# Patient Record
Sex: Male | Born: 1975 | Race: White | Hispanic: No | Marital: Married | Smoking: Current every day smoker
Health system: Southern US, Community
[De-identification: ages and names within clinical notes are randomized; demographics above are authoritative.]

## PROBLEM LIST (undated history)

## (undated) DIAGNOSIS — N200 Calculus of kidney: Secondary | ICD-10-CM

## (undated) DIAGNOSIS — C6290 Malignant neoplasm of unspecified testis, unspecified whether descended or undescended: Secondary | ICD-10-CM

## (undated) HISTORY — DX: Malignant neoplasm of unspecified testis, unspecified whether descended or undescended: C62.90

## (undated) HISTORY — PX: RADICAL ORCHIECTOMY: SHX2285

## (undated) HISTORY — DX: Calculus of kidney: N20.0

---

## 2004-10-05 ENCOUNTER — Emergency Department: Payer: Self-pay | Admitting: Emergency Medicine

## 2005-03-01 ENCOUNTER — Ambulatory Visit: Payer: Self-pay | Admitting: Family Medicine

## 2005-05-25 ENCOUNTER — Emergency Department: Payer: Self-pay | Admitting: Emergency Medicine

## 2006-06-23 ENCOUNTER — Emergency Department: Payer: Self-pay | Admitting: Emergency Medicine

## 2006-08-28 ENCOUNTER — Emergency Department: Payer: Self-pay | Admitting: Emergency Medicine

## 2007-07-08 ENCOUNTER — Encounter (INDEPENDENT_AMBULATORY_CARE_PROVIDER_SITE_OTHER): Payer: Self-pay | Admitting: *Deleted

## 2007-07-31 ENCOUNTER — Ambulatory Visit: Payer: Self-pay | Admitting: Family Medicine

## 2007-07-31 DIAGNOSIS — J309 Allergic rhinitis, unspecified: Secondary | ICD-10-CM | POA: Insufficient documentation

## 2007-08-05 ENCOUNTER — Encounter: Payer: Self-pay | Admitting: Family Medicine

## 2008-02-02 ENCOUNTER — Emergency Department: Payer: Self-pay | Admitting: Emergency Medicine

## 2008-02-04 ENCOUNTER — Inpatient Hospital Stay: Payer: Self-pay | Admitting: Internal Medicine

## 2008-05-02 ENCOUNTER — Emergency Department: Payer: Self-pay | Admitting: Emergency Medicine

## 2008-05-03 ENCOUNTER — Inpatient Hospital Stay: Payer: Self-pay | Admitting: Internal Medicine

## 2008-10-20 ENCOUNTER — Ambulatory Visit: Payer: Self-pay | Admitting: Family Medicine

## 2008-10-20 DIAGNOSIS — A630 Anogenital (venereal) warts: Secondary | ICD-10-CM | POA: Insufficient documentation

## 2008-12-07 ENCOUNTER — Ambulatory Visit: Payer: Self-pay | Admitting: Family Medicine

## 2008-12-07 DIAGNOSIS — N200 Calculus of kidney: Secondary | ICD-10-CM

## 2008-12-07 DIAGNOSIS — M6740 Ganglion, unspecified site: Secondary | ICD-10-CM

## 2008-12-07 HISTORY — DX: Calculus of kidney: N20.0

## 2009-05-18 ENCOUNTER — Ambulatory Visit: Payer: Self-pay | Admitting: Family Medicine

## 2009-05-18 DIAGNOSIS — M25539 Pain in unspecified wrist: Secondary | ICD-10-CM | POA: Insufficient documentation

## 2009-07-04 ENCOUNTER — Ambulatory Visit: Payer: Self-pay | Admitting: Family Medicine

## 2009-08-15 ENCOUNTER — Ambulatory Visit: Payer: Self-pay | Admitting: Family Medicine

## 2009-09-26 ENCOUNTER — Ambulatory Visit: Payer: Self-pay | Admitting: Family Medicine

## 2009-09-26 DIAGNOSIS — J019 Acute sinusitis, unspecified: Secondary | ICD-10-CM | POA: Insufficient documentation

## 2009-09-29 ENCOUNTER — Telehealth: Payer: Self-pay | Admitting: Family Medicine

## 2009-09-30 ENCOUNTER — Ambulatory Visit: Payer: Self-pay | Admitting: Family Medicine

## 2009-10-01 ENCOUNTER — Encounter: Payer: Self-pay | Admitting: Family Medicine

## 2009-11-07 ENCOUNTER — Telehealth: Payer: Self-pay | Admitting: Family Medicine

## 2009-11-10 ENCOUNTER — Ambulatory Visit: Payer: Self-pay | Admitting: Family Medicine

## 2010-03-01 ENCOUNTER — Ambulatory Visit: Payer: Self-pay | Admitting: Family Medicine

## 2010-03-01 DIAGNOSIS — L989 Disorder of the skin and subcutaneous tissue, unspecified: Secondary | ICD-10-CM | POA: Insufficient documentation

## 2010-03-01 DIAGNOSIS — F17200 Nicotine dependence, unspecified, uncomplicated: Secondary | ICD-10-CM

## 2010-03-01 DIAGNOSIS — M25569 Pain in unspecified knee: Secondary | ICD-10-CM | POA: Insufficient documentation

## 2010-03-01 DIAGNOSIS — L259 Unspecified contact dermatitis, unspecified cause: Secondary | ICD-10-CM

## 2010-03-09 ENCOUNTER — Telehealth: Payer: Self-pay | Admitting: Family Medicine

## 2010-03-29 ENCOUNTER — Ambulatory Visit: Payer: Self-pay | Admitting: Family Medicine

## 2010-03-29 DIAGNOSIS — K921 Melena: Secondary | ICD-10-CM | POA: Insufficient documentation

## 2010-03-29 DIAGNOSIS — R21 Rash and other nonspecific skin eruption: Secondary | ICD-10-CM | POA: Insufficient documentation

## 2010-03-30 LAB — CONVERTED CEMR LAB
Basophils Absolute: 0 10*3/uL (ref 0.0–0.1)
Eosinophils Relative: 2.8 % (ref 0.0–5.0)
H Pylori IgG: NEGATIVE
MCV: 92.5 fL (ref 78.0–100.0)
Monocytes Absolute: 0.5 10*3/uL (ref 0.1–1.0)
Neutrophils Relative %: 66.9 % (ref 43.0–77.0)
Platelets: 176 10*3/uL (ref 150.0–400.0)
RDW: 12.7 % (ref 11.5–14.6)
WBC: 7.8 10*3/uL (ref 4.5–10.5)

## 2010-05-12 ENCOUNTER — Telehealth: Payer: Self-pay | Admitting: Family Medicine
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
## 2010-07-25 NOTE — Assessment & Plan Note (Signed)
Summary: KNOTS UNDER ARM PITS/DLO   Vital Signs:  Patient profile:   35 year old male Weight:      230 pounds Temp:     98.5 degrees F oral Pulse rate:   76 / minute Pulse rhythm:   regular BP sitting:   120 / 72  (left arm) Cuff size:   large  Vitals Entered By: Sydell Axon LPN (August 15, 2009 9:25 AM) CC: Knots under both arm pits and soreness and  bumps on back   History of Present Illness: Pt of Dr Copland's here for painful knots under both arm pits who wanted top be seen quickly. Friday the nodes  were larger and more painful. He now has none under the left, the right still has some  there. He has an outbreak on the back and upper arms of individual minimally papular 2mm lesions sporadically. He has gone online and realizes the lymph nodes are  probably  from infection. He had an ingrown hair of right face, hair 11/2 inches long when he did "mior surgery" on himself to extract it. He is now allowing his beard to grow out to  allow healing. Marland Kitchen He had had signif MRSA infection of the lower left leg with hospitalization approx 2 years ago and was sunbsequently on Clindamycin a while and hasn't had known recurrence. He otherwise feels well and is very healthy, exercising regularly.  Problems Prior to Update: 1)  Laceration of Finger  (ICD-883.0) 2)  Wrist Pain, Right  (ICD-719.43) 3)  Nephrolithiasis  (ICD-592.0) 4)  Ganglion Cyst, Wrist, Right  (ICD-727.41) 5)  Sexually Transmitted Disease, Exposure To  (ICD-V01.6) 6)  Venereal Wart  (ICD-078.11) 7)  Uri  (ICD-465.9) 8)  Allergic Rhinitis Cause Unspecified  (ICD-477.9)  Medications Prior to Update: 1)  None  Allergies: No Known Drug Allergies  Physical Exam  General:  Well-developed,well-nourished,in no acute distress; alert,appropriate and cooperative throughout examination Head:  Normocephalic and atraumatic without obvious abnormalities. No apparent alopecia or balding. Eyes:  Conjunctiva clear bilaterally.  Ears:   External ear exam shows no significant lesions or deformities.  Otoscopic examination reveals clear canals, tympanic membranes are intact bilaterally without bulging, retraction, inflammation or discharge. Hearing is grossly normal bilaterally. Nose:  External nasal examination shows no deformity or inflammation. Nasal mucosa are pink and moist without lesions or exudates. Mouth:  Oral mucosa and oropharynx without lesions or exudates.  Teeth in good repair. Neck:  No deformities, masses, or tenderness noted. Chest Wall:  No deformities, masses, tenderness or gynecomastia noted. Lungs:  Normal respiratory effort, chest expands symmetrically. Lungs are clear to auscultation, no crackles or wheezes. Heart:  Normal rate and regular rhythm. S1 and S2 normal without gallop, murmur, click, rub or other extra sounds. Abdomen:  Bowel sounds positive,abdomen soft and non-tender without masses, organomegaly or hernias noted. Msk:  No deformity or scoliosis noted of thoracic or lumbar spine.   Extremities:  No clubbing, cyanosis, edema, or deformity noted with normal full range of motion of all joints.  Tatoo of arm. Skin:  Sparse focal papular outbreak of mildly erythematous lesions ont eh trunk mostly, some on the upper extremities. No warmth, no tenderness. Cervical Nodes:  No lymphadenopathy noted, no supraclavicular nodes felt. Axillary Nodes:  Shoddy' mobile slightly tender nodes of bilat armpits, L axilla lat, right axilla medial.  Inguinal Nodes:  No significant adenopathy   Impression & Recommendations:  Problem # 1:  LYMPHADENOPATHY, REACTIVE, BILAT AXILL. (ICD-785.6) Assessment New  Appears reactive  and resolving. Cont to observe and return in 2 weeks if not resolved.  Problem # 2:  DERMATITIS (ICD-692.9) Assessment: New Appears viral....observe. Use emollients as needed.  Complete Medication List: 1)  Ibuprofen 200 Mg Tabs (Ibuprofen) .... As needed 2)  Colon Cleanser Caps (Misc natural  products) .... As needed  Patient Instructions: 1)  RTC 2 weeks if lymphadenopathy not resolved  Current Allergies (reviewed today): No known allergies

## 2010-07-25 NOTE — Progress Notes (Signed)
Summary: pt thinks he has MRSA  Phone Note Call from Patient Call back at [phone]   Caller: Patient Call For: Hannah Beat MD Summary of Call: Pt was seen on 4/04.  He has some spots that he thinks are MRSA.  He wants cultures done, blood work and a stronger abx.  You won't be here tomorrow, there are no appts with anyone else.  Is this ok to wait till monday?  Please advise. Initial call taken by: Lowella Petties CMA,  September 29, 2009 4:06 PM  Follow-up for Phone Call        He has to be seen, determined if this needs to be I and D, etc.  This cannot wait.  Continue his Augmentin and will add MRSA coverage to broaden  I am going to ask Dr. Dayton Martes to see if she can check him -- it looks like there is some space in her schedule Follow-up by: Hannah Beat MD,  September 29, 2009 4:13 PM  Additional Follow-up for Phone Call Additional follow up Details #1::        Looks like bedsole has a 12:15, if she cannot do it, then you can double book me over a new pt appt. Additional Follow-up by: Ruthe Mannan MD,  September 29, 2009 4:21 PM    Additional Follow-up for Phone Call Additional follow up Details #2::    [facility] [address]  I changed pt's appt to Dr. Milinda Antis tomorrow at Dr. Dellie Catholic request. Lowella Petties CMA  September 29, 2009 4:29 PM   New/Updated Medications: SULFAMETHOXAZOLE-TMP DS 800-160 MG TABS (SULFAMETHOXAZOLE-TRIMETHOPRIM) 2 by mouth two times a day Prescriptions: SULFAMETHOXAZOLE-TMP DS 800-160 MG TABS (SULFAMETHOXAZOLE-TRIMETHOPRIM) 2 by mouth two times a day  #40 x 0   Entered and Authorized by:   Hannah Beat MD   Signed by:   Hannah Beat MD on 09/29/2009   Method used:   Telephoned to ...         RxID:   [id number]
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
## 2010-07-25 NOTE — Progress Notes (Signed)
Summary: regarding skin lesion  Phone Note Call from Patient Call back at Home Phone [phone]   Caller: Patient Summary of Call: Pt recently had skin bx done- benign, but pt states the area is red, swollen, appears to be infected, has a black core.  Do you want me to schedule appt for you to possibly  remove? Initial call taken by: Lowella Petties CMA,  March 09, 2010 11:03 AM  Follow-up for Phone Call        Needs appt for lesion to be examined...sounds infected...apply neosporin ointment and bandage. Wash with warm soapy water two times a day.  Follow-up by: Kerby Nora MD,  March 09, 2010 11:25 AM  Additional Follow-up for Phone Call Additional follow up Details #1::        Reasonable to recheck - I did use 3 silver nitrate sticks to stop bleeding which may account for black coloration.   agree that wound recheck the best idea.  Additional Follow-up by: Hannah Beat MD,  March 10, 2010 6:06 AM    Additional Follow-up for Phone Call Additional follow up Details #2::    Patient advised.Consuello Masse CMA    via messge on cell phone to call and schedule appt for recheck Follow-up by: Benny Lennert CMA Duncan Dull),  March 10, 2010 9:27 AM

## 2010-07-25 NOTE — Assessment & Plan Note (Signed)
Summary: ? MRSA   Vital Signs:  Patient profile:   35 year old male Height:      73 inches Weight:      229.0 pounds BMI:     30.32 Temp:     98.2 degrees F oral Pulse rate:   76 / minute Pulse rhythm:   regular BP sitting:   120 / 78  (left arm) Cuff size:   regular  Vitals Entered By: Benny Lennert CMA Duncan Dull) (Nov 10, 2009 10:37 AM)  History of Present Illness: Chief complaint ? MRSA  35 yo h/o MRSA  now with groin rash itchy scale  has done MRSA protocol with bactroban and hibiclens before  feels fine otherwise  GEN: Well-developed,well-nourished,in no acute distress; alert,appropriate and cooperative throughout examination HEENT: Normocephalic and atraumatic without obvious abnormalities. No apparent alopecia or balding. Ears, externally no deformities PULM: Breathing comfortably in no respiratory distress EXT: No clubbing, cyanosis, or edema PSYCH: Normally interactive. Cooperative during the interview. Pleasant. Friendly and conversant. Not anxious or depressed appearing. Normal, full affect.   Skin: healing old scar, lower abd. flat, scaly rash, R groin   Allergies (verified): No Known Drug Allergies   Impression & Recommendations:  Problem # 1:  RINGWORM (ICD-110.9) discussed  Please see the patient instructions for a detailed list of plans and what was discussed with the patient.   Complete Medication List: 1)  Ibuprofen 200 Mg Tabs (Ibuprofen) .... As needed  Patient Instructions: 1)  CLOTRIMAZOLE 2)  f/u for 30 minute appt for procedure  Current Allergies (reviewed today): No known allergies

## 2010-07-25 NOTE — Progress Notes (Signed)
Summary: ? still has MRSA  Phone Note Call from Patient Call back at Home Phone [phone]   Caller: Patient Call For: Hannah Beat MD Summary of Call: Pt was given for septra for ? MRSA.  He has finished this but he is getting new spots.  Asks if he should take more abx.  [facility] [address] Initial call taken by: Lowella Petties CMA,  Nov 07, 2009 11:15 AM  Follow-up for Phone Call        Patient needs to be examined if has potential MRSA to fully evaluate. Follow-up by: Hannah Beat MD,  Nov 07, 2009 1:18 PM  Additional Follow-up for Phone Call Additional follow up Details #1::        Advised pt, appt made. Additional Follow-up by: Lowella Petties CMA,  Nov 07, 2009 2:48 PM

## 2010-07-25 NOTE — Progress Notes (Signed)
Summary: wants phone call   Phone Note Call from Patient Call back at Home Phone [phone]   Caller: Patient Call For: Hannah Beat MD Summary of Call: Patient is asking if you could given him a call. I asked him if there was anything that I could help him with, the only thing that he would tell me is that he want to talk with you about his last office visit.  Initial call taken by: Melody Comas,  May 12, 2010 10:36 AM  Follow-up for Phone Call        i honestly need more information to help triage.  call and get. Spencer Copland MD  May 12, 2010 10:38 AM  Called patient back, explained to him that you needed more information. Patient refused to tell me anything, says that it is really personal and that he needs to speak with you directly. He said that if he can't talk with you directly over the phone then he will wait until the next time he comes in to address this issue.    Melody Comas  May 12, 2010 11:35 AM   Additional Follow-up for Phone Call Additional follow up Details #1::        discussed with Myrle on the phone - personal nature regarding potential HSV --    Lyla Son, can you set up an appointment with me on Tuesday, and call Eduardo at the number above? Additional Follow-up by: Hannah Beat MD,  May 12, 2010 1:14 PM    Additional Follow-up for Phone Call Additional follow up Details #2::    I attempted calling pt 3 times w/ no answer and left a message on his voice mail asking pt to schedule appointment on Tuesday,November 22. Follow-up by: Beau Fanny,  May 12, 2010 4:41 PM  Additional Follow-up for Phone Call Additional follow up Details #3:: Details for Additional Follow-up Action Taken: up to his discretion. not emergent. Hannah Beat MD  May 12, 2010 4:43 PM

## 2010-07-25 NOTE — Miscellaneous (Signed)
Summary: [facility]  [facility]   Imported By: Sherian Rein 03/08/2010 08:02:34  _____________________________________________________________________  External Attachment:    Type:   Image     Comment:   External Document

## 2011-01-04 ENCOUNTER — Emergency Department: Payer: Self-pay | Admitting: Emergency Medicine

## 2011-11-25 ENCOUNTER — Emergency Department: Payer: Self-pay | Admitting: Emergency Medicine

## 2011-11-25 LAB — BASIC METABOLIC PANEL
Anion Gap: 10 (ref 7–16)
Co2: 28 mmol/L (ref 21–32)
Glucose: 102 mg/dL — ABNORMAL HIGH (ref 65–99)
Potassium: 3.6 mmol/L (ref 3.5–5.1)
Sodium: 141 mmol/L (ref 136–145)

## 2011-11-25 LAB — CBC
HGB: 13.8 g/dL (ref 13.0–18.0)
RDW: 12.8 % (ref 11.5–14.5)
WBC: 8.3 10*3/uL (ref 3.8–10.6)

## 2012-08-20 ENCOUNTER — Emergency Department: Payer: Self-pay

## 2012-10-27 DIAGNOSIS — C6290 Malignant neoplasm of unspecified testis, unspecified whether descended or undescended: Secondary | ICD-10-CM | POA: Insufficient documentation

## 2012-11-26 ENCOUNTER — Emergency Department: Payer: Self-pay | Admitting: Emergency Medicine

## 2013-03-11 ENCOUNTER — Emergency Department: Payer: Self-pay | Admitting: Emergency Medicine

## 2013-07-14 ENCOUNTER — Telehealth: Payer: Self-pay

## 2013-07-14 NOTE — Telephone Encounter (Signed)
Patient had a chemo treatment with a numbness in left arm. Please call ASAP!

## 2013-07-14 NOTE — Telephone Encounter (Signed)
Spoke w/ pt.  He reports that he has been experiencing left arm tingling and numbness since he had a scan at the cancer center today. Reports that he does not have a port and was given pill today, nothing was injected. 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 Denies chest pain, sweating, nausea or vomiting.  Reports that he has had no increase in HR or temp. Advised pt that symptoms do not seem cardiac related but would schedule an appt to come in and see one of our doctors, but he states that he was just hoping to get some advice. Advised pt to contact his oncologist or PCP, as we have not seen pt and I have no history or med list for him. He is agreeable to this and will contact us if he would like to schedule an appt.

## 2014-07-05 IMAGING — CR RIGHT FOOT COMPLETE - 3+ VIEW
1 series · 3 of 3 positions shown · non-contrast
Comparison: none

REASON FOR EXAM: pain, swelling, worse over 5th metatarsal
COMMENTS:

PROCEDURE:     DXR - DXR FOOT RT COMPLETE W/OBLIQUES  - August 20, 2012  [DATE]
RESULT:     Right foot images demonstrate no fracture, dislocation or
radiopaque foreign body.

[Series 1: ap · 0.17mm/px · 3 of 3 slices shown]
[im 1/3]
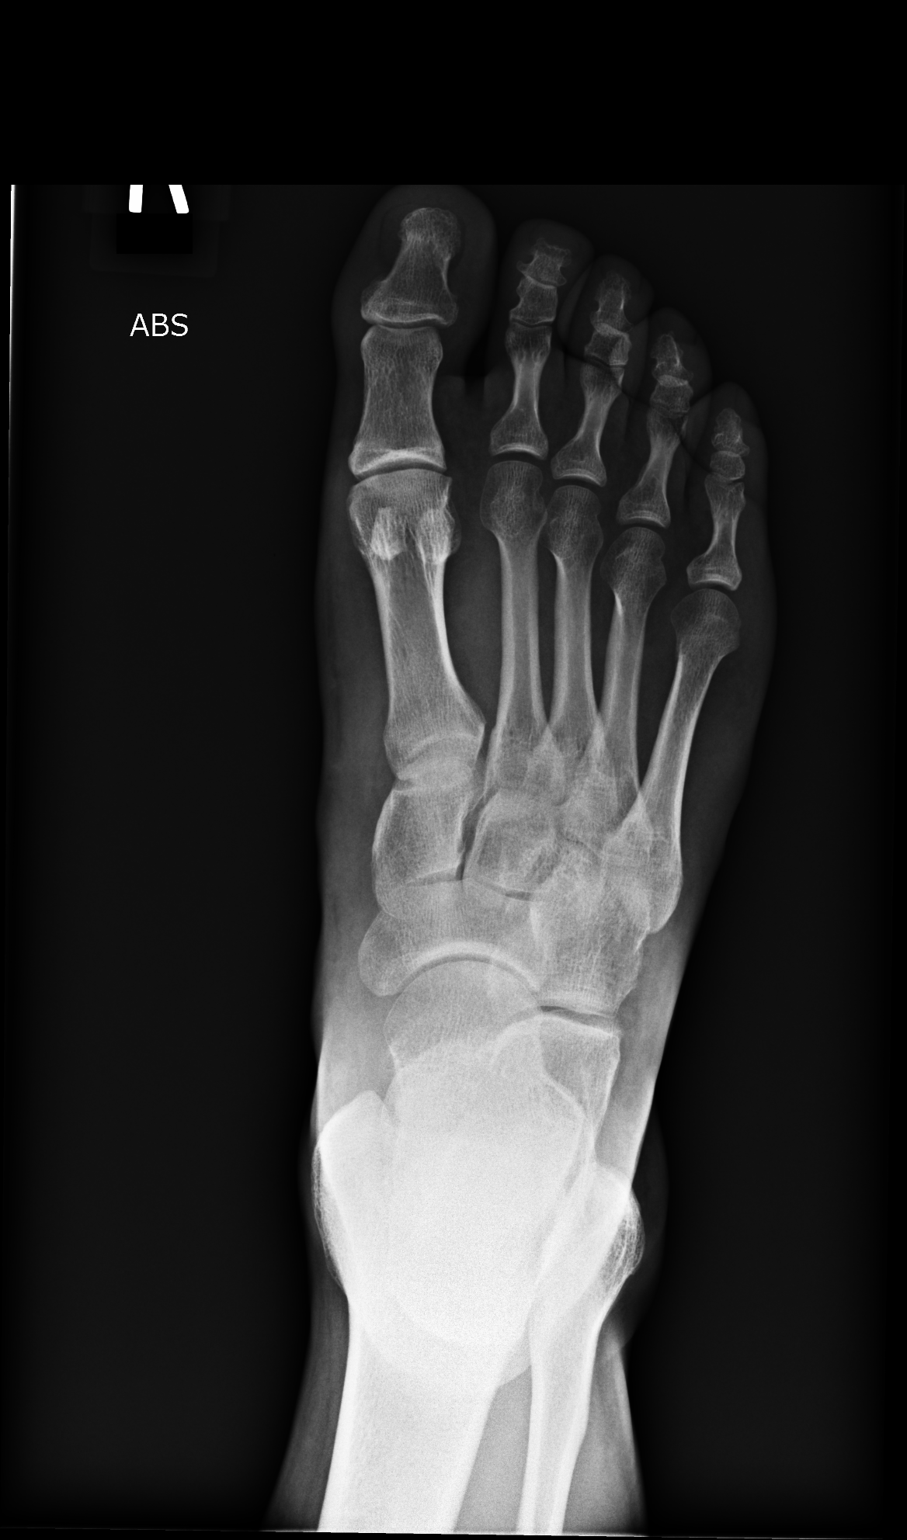
[im 2/3]
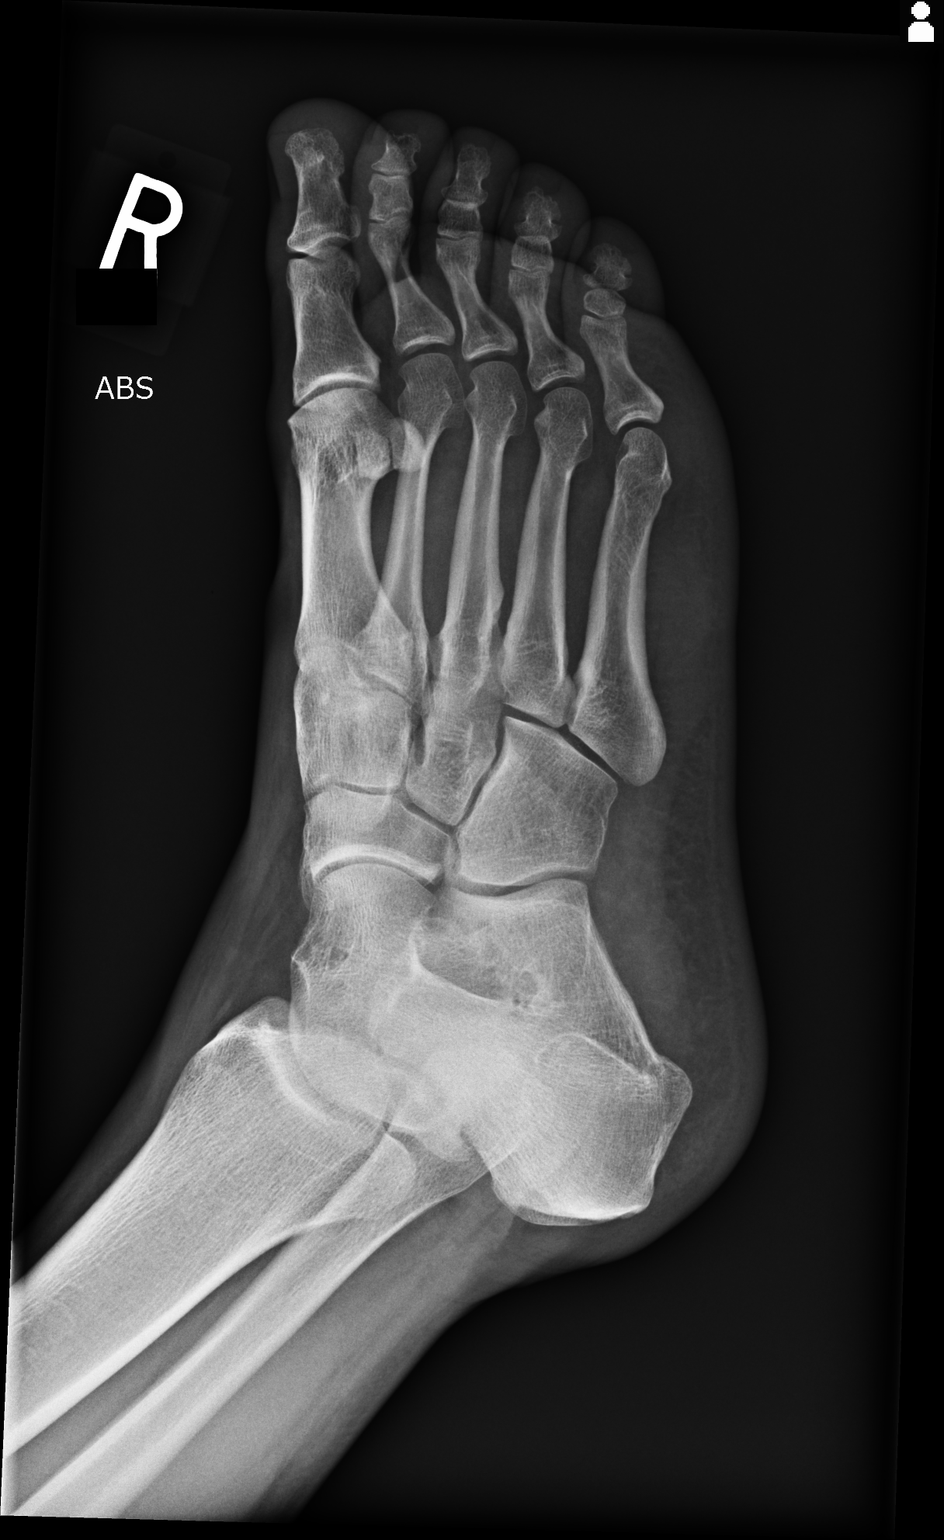
[im 3/3]
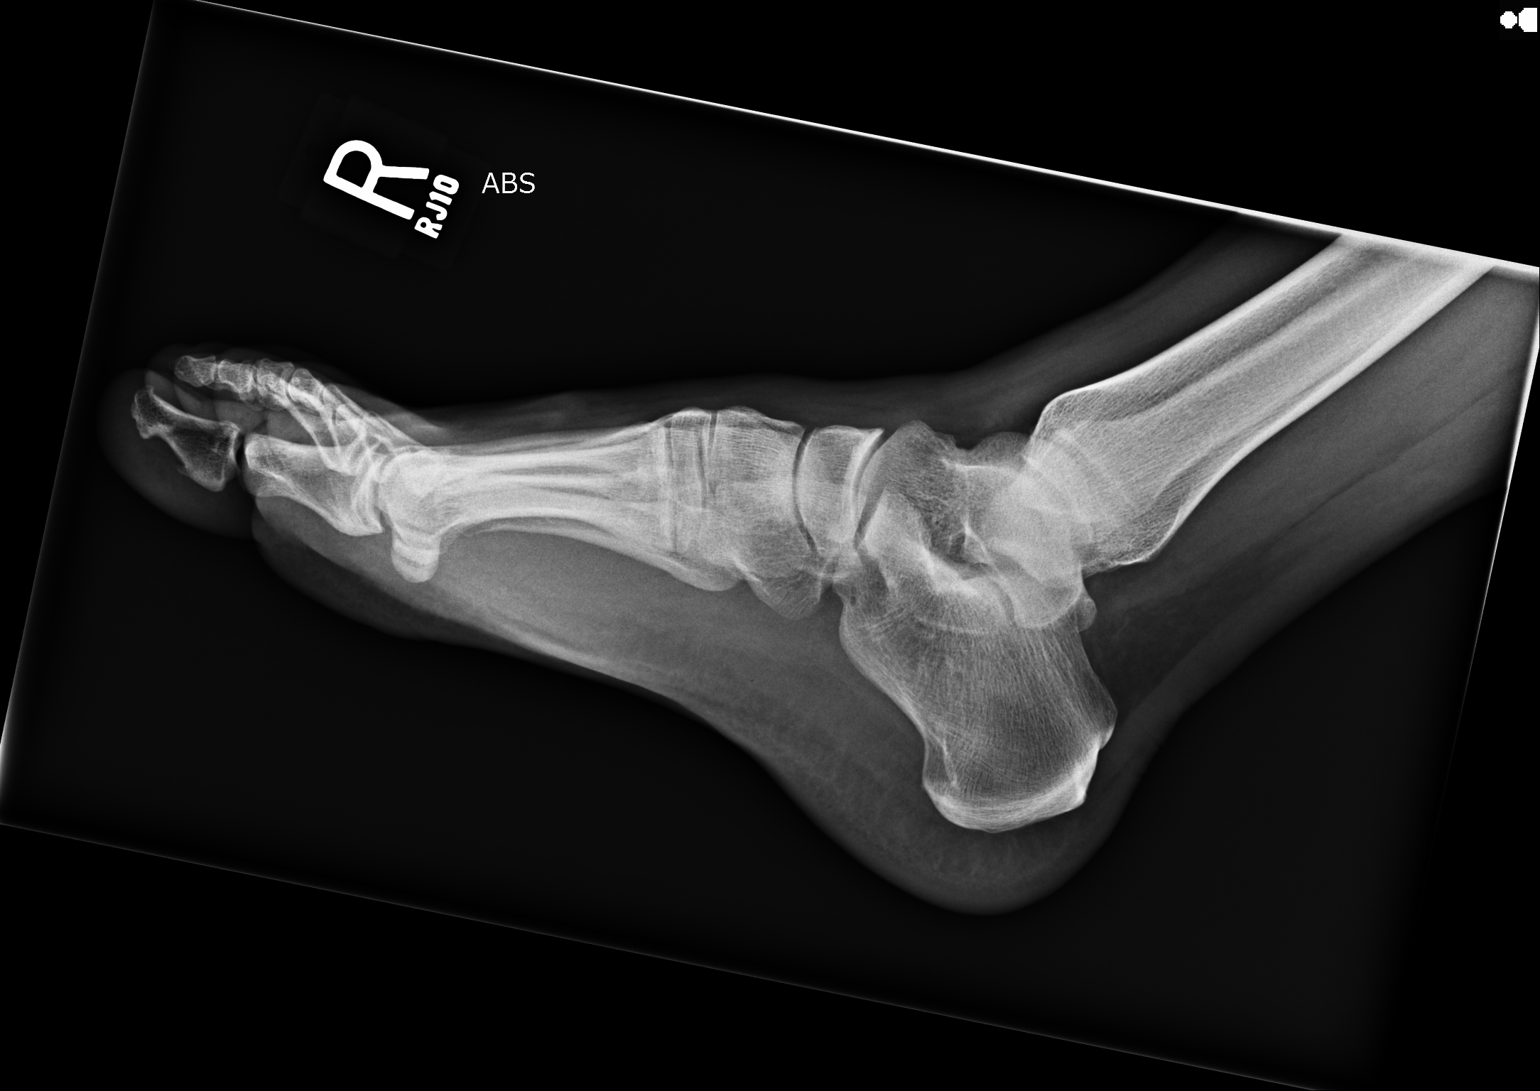

[3 of 3 positions shown; findings below may reference images not displayed]

IMPRESSION: No acute bony abnormality evident.

[REDACTED]

## 2015-01-24 IMAGING — CR LEFT THUMB 2+V
1 series · 3 of 3 positions shown · non-contrast
Comparison: none

REASON FOR EXAM: pain s/p injury with laceration
COMMENTS:

[Series 1: pa · 0.17mm/px · 3 of 3 slices shown]
[im 1/3]
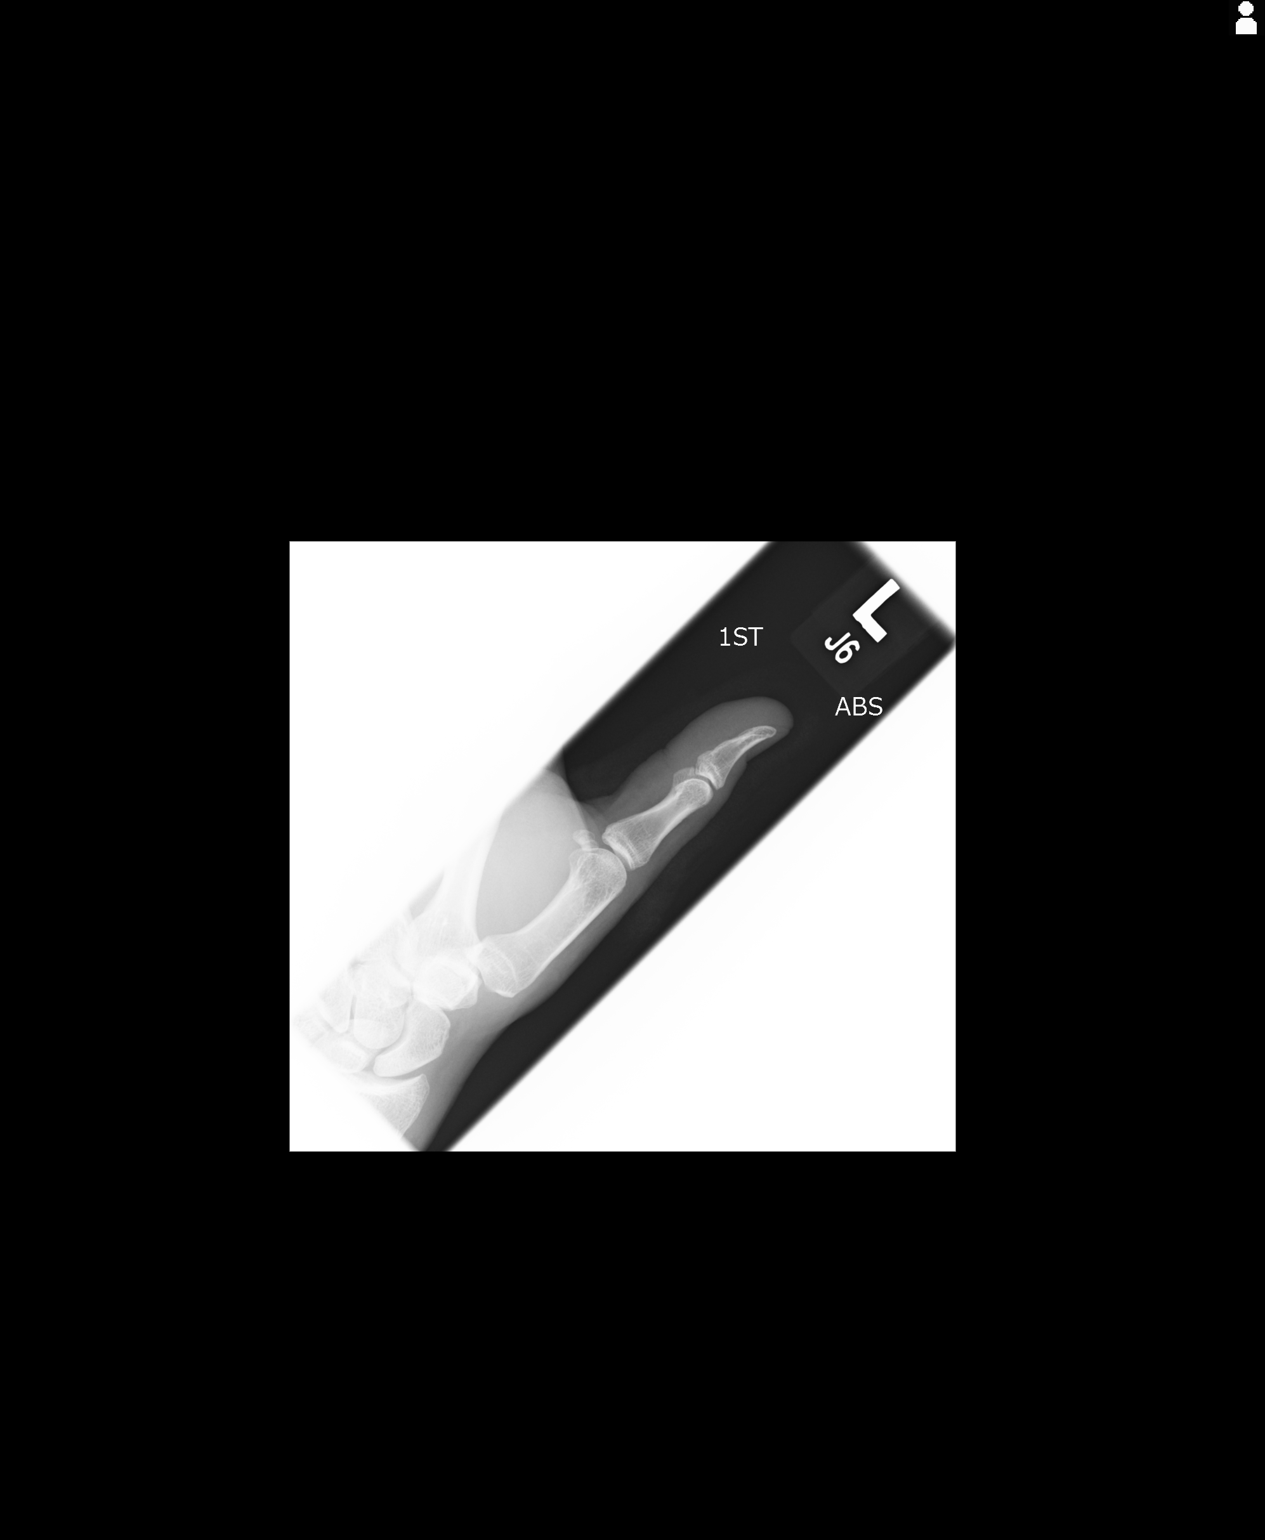
[im 2/3]
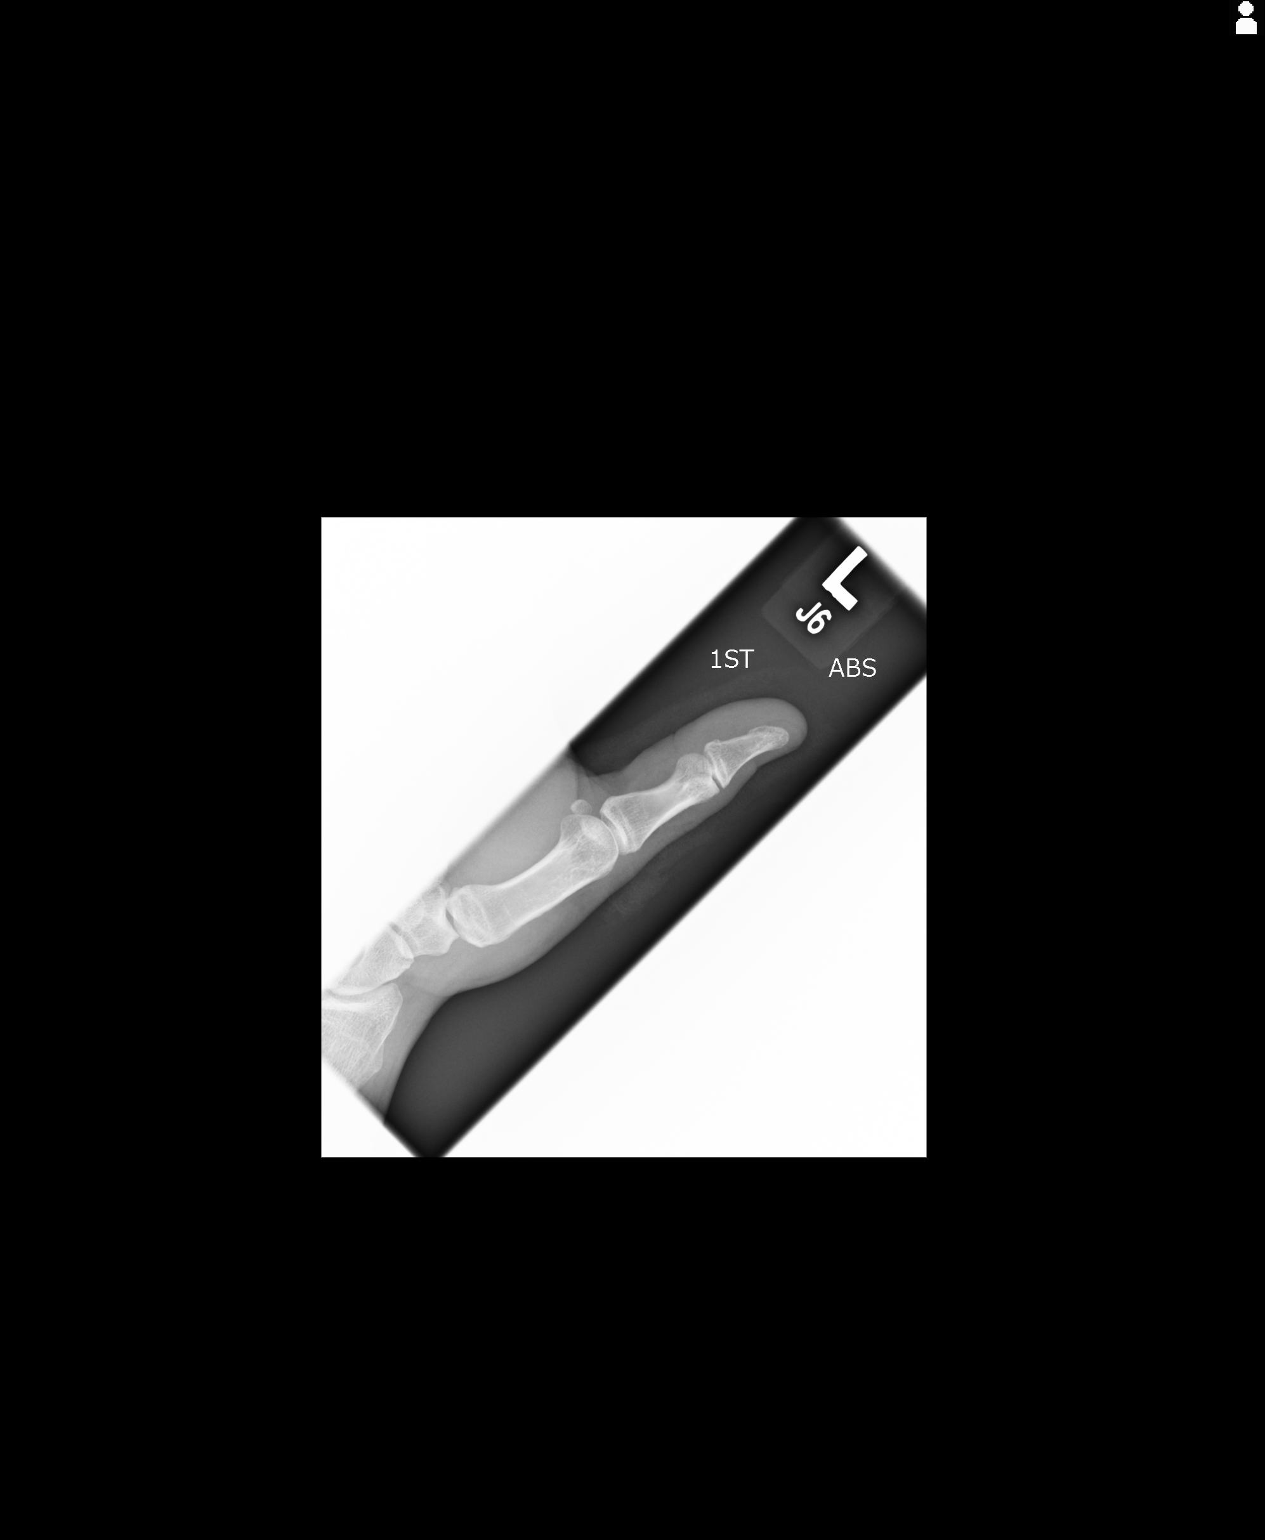
[im 3/3]
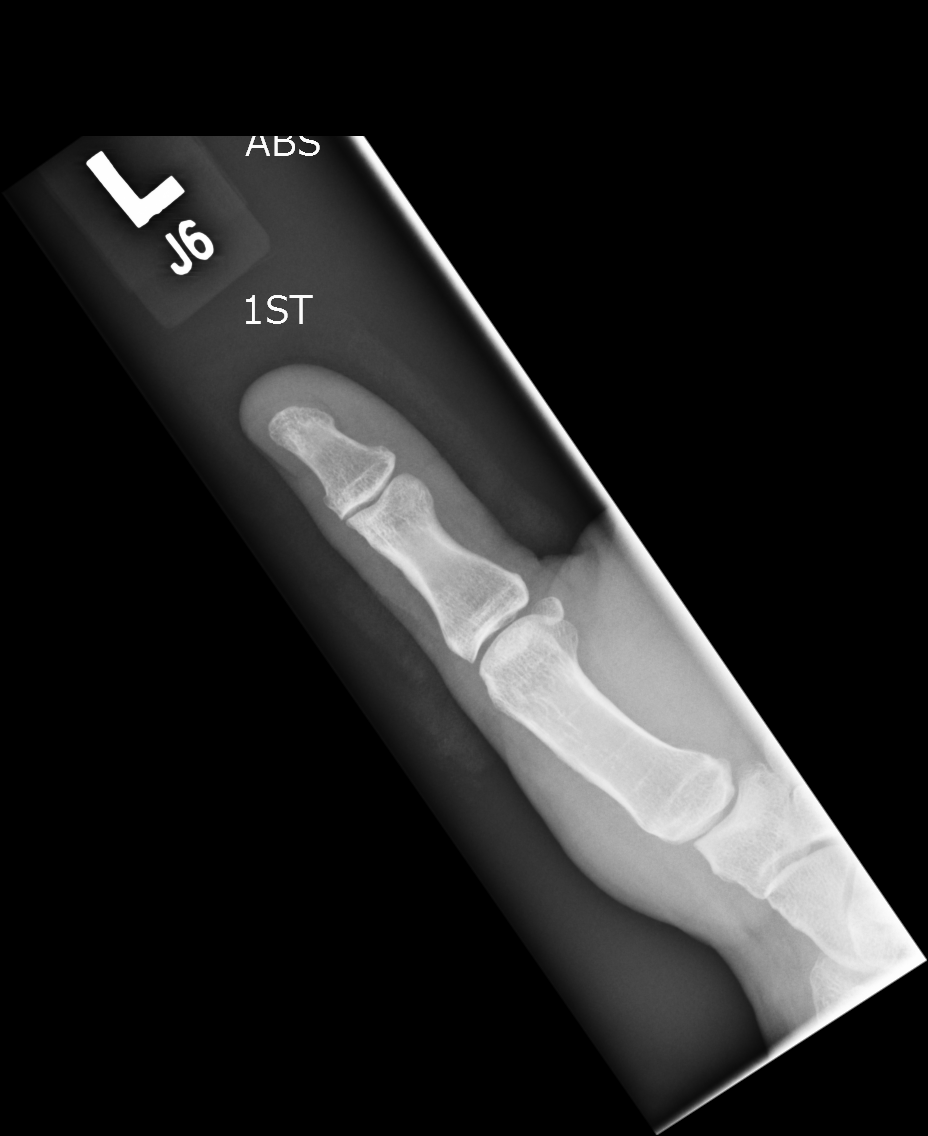

[3 of 3 positions shown; findings below may reference images not displayed]

PROCEDURE:     DXR - DXR THUMB LEFT HAND (1ST DIGIT)  - March 11, 2013 [DATE]

RESULT:     Three views of the left thumb reveal the bones to be adequately
mineralized. There is no evidence of an acute fracture. The joint spaces are
preserved. No radiopaque foreign body within the thumb is demonstrated.
IMPRESSION: There is no evidence of acute bony abnormality of the left
thumb. No radiopaque foreign bodies are demonstrated.

[REDACTED]

## 2016-06-04 ENCOUNTER — Encounter: Payer: Self-pay | Admitting: Family Medicine

## 2016-06-04 ENCOUNTER — Ambulatory Visit (INDEPENDENT_AMBULATORY_CARE_PROVIDER_SITE_OTHER): Payer: Self-pay | Admitting: Family Medicine

## 2016-06-04 ENCOUNTER — Encounter: Payer: Self-pay | Admitting: *Deleted

## 2016-06-04 VITALS — BP 162/80 | HR 78 | Temp 99.0°F | Ht 72.25 in | Wt 247.5 lb

## 2016-06-04 DIAGNOSIS — C6290 Malignant neoplasm of unspecified testis, unspecified whether descended or undescended: Secondary | ICD-10-CM | POA: Insufficient documentation

## 2016-06-04 DIAGNOSIS — M7712 Lateral epicondylitis, left elbow: Secondary | ICD-10-CM

## 2016-06-04 MED ORDER — METHYLPREDNISOLONE ACETATE 40 MG/ML IJ SUSP
40.0000 mg | Freq: Once | INTRAMUSCULAR | Status: AC
  Administered 2016-06-04: 40 mg via INTRA_ARTICULAR

## 2016-06-04 NOTE — Progress Notes (Signed)
Pre visit review using our clinic review tool, if applicable. No additional management support is needed unless otherwise documented below in the visit note. 

## 2016-06-04 NOTE — Progress Notes (Signed)
Dr. Frederico Hamman T. Navpreet Szczygiel, MD, [facility] [place], [address] Phone: [phone] Fax: [phone]  06/04/2016  Patient: Todd Quinn, MRN: [id number], DOB: Jun 05, 1976, 40 y.o.  Primary Physician:  Owens Loffler, MD   Chief Complaint  Patient presents with  . Neck/Shoulder Pain    Left   Subjective:   Todd Quinn presents with lateral elbow pain.  Length of symptoms: 6 weeks Hand effected: L   Patient describes a dull ache on the lateral elbow. There is some translation in the proximal forearm and in the distal upper arm. It is painful to lift with the hand facing down and to lift with the thumb in an upright position. Supination is painful. Patient points to the lateral epicondyle as the point of maximal tenderness near ECRB.  Traumatic event changing a tire.   No prior fractures or operative interventions in the effective hand. Prior PT or HEP: none  Denies numbness or tingling - some in the L but none in neck or upper arm.   The PMH, PSH, Social History, Family History, Medications, and allergies have been reviewed in [facility], and have been updated if relevant.  Patient Active Problem List   Diagnosis Date Noted  . 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 TOBACCO USE 03/01/2010  . CONTACT DERMATITIS&OTHER ECZEMA DUE UNSPEC CAUSE 03/01/2010  . NEPHROLITHIASIS 12/07/2008  . GANGLION CYST, WRIST, RIGHT 12/07/2008  . VENEREAL WART 10/20/2008  . ALLERGIC RHINITIS CAUSE UNSPECIFIED 07/31/2007    Past Medical History:  Diagnosis Date  . Testicular cancer in child [facility])     Past Surgical History:  Procedure Laterality Date  . RADICAL ORCHIECTOMY Right     Social History   Social History  . Marital status: Married    Spouse name: N/A  . Number of children: N/A  . Years of education: N/A   Occupational History  . Not on file.   Social History Main Topics  . Smoking status: Current Every Day  Smoker    Packs/day: 0.75    Types: Cigarettes  . Smokeless tobacco: Former Systems developer  . Alcohol use Yes     Comment: very little  . Drug use: No  . Sexual activity: Not on file   Other Topics Concern  . Not on file   Social History Narrative  . No narrative on file    Family History  Problem Relation Age of Onset  . Heart disease Father     No Known Allergies  Medication list reviewed and updated in full in [place].  GEN: No fevers, chills. Nontoxic. Primarily MSK c/o today. MSK: Detailed in the HPI GI: tolerating PO intake without difficulty Neuro: No numbness, parasthesias, or tingling associated. Otherwise the pertinent positives of the ROS are noted above.   Objective:   Blood pressure (!) 162/80, pulse 78, temperature 99 F (37.2 C), temperature source Oral, height 6' 0.25" (1.835 m), weight 247 lb 8 oz (112.3 kg).  GEN: Well-developed,well-nourished,in no acute distress; alert,appropriate and cooperative throughout examination HEENT: Normocephalic and atraumatic without obvious abnormalities. Ears, externally no deformities PULM: Breathing comfortably in no respiratory distress EXT: No clubbing, cyanosis, or edema PSYCH: Normally interactive. Cooperative during the interview. Pleasant. Friendly and conversant. Not anxious or depressed appearing. Normal, full affect.   Shoulder: L Inspection: No muscle wasting or winging Ecchymosis/edema: neg  AC joint, scapula, clavicle: NT Cervical spine: NT, full ROM Spurling's: neg Abduction: full, 5/5 Flexion: full,  5/5 IR, full, lift-off: 5/5 ER at neutral: full, 5/5 AC crossover and compression: neg Neer: neg Hawkins: neg Drop Test: neg Empty Can: neg Supraspinatus insertion: NT Bicipital groove: NT Speed's: neg Yergason's: neg Sulcus sign: neg Scapular dyskinesis: none C5-T1 intact Sensation intact Grip 5/5   L elbow Ecchymosis or edema: neg ROM: full flexion, extension, pronation,  supination Shoulder ROM: Full Flexion: 5/5 Extension: 5/5, PAINFUL Supination: 5/5, PAINFUL Pronation: 5/5 Wrist ext: 5/5 Wrist flexion: 5/5 No gross bony abnormality Varus and Valgus stress: stable ECRB tenderness: YES, TTP Medial epicondyle: NT Lateral epicondyle, resisted wrist extension from wrist full pronation and flexion: PAINFUL grip: 4/5 on L  sensation intact Tinel's, Elbow: negative  Subjective:   Left lateral epicondylitis - Plan: methylPREDNISolone acetate (DEPO-MEDROL) injection 40 mg  Elbow anatomy was reviewed, and tendinopathy was explained.  Pt. given a formal rehab program from [facility] on elbow rehabiliation.  Series of concentric and eccentric exercises should be done starting with no weight, work up to 1 lb, hammer, etc. Formal PT would be beneficial. Emphasized stretching an cross-friction massage Emphasized proper palms up lifting biomechanics to unload ECRB  Lateral Epicondylitis Injection, L Verbal consent was obtained from the patient. Risks, benefits, and alternatives were discussed. Potential complications including loss of pigment, atrophy, and rare risk of infection were discussed. Prepped with Chloraprep and Ethyl Chloride used for anesthesia. Under sterile conditions, the patient was injected at the point of maximal tenderness at the ECRB tendon with 2 cc of Lidocaine 1% and 1 cc of Depo-Medrol 40 mg. Decreased pain after injection. No complications.  Needle size: 22 gauge 1 1/2 inch   Follow-up: No Follow-up on file.  Signed,  Maud Deed. Minetta Krisher, MD   Patient's Medications   No medications on file

## 2016-08-13 ENCOUNTER — Encounter (INDEPENDENT_AMBULATORY_CARE_PROVIDER_SITE_OTHER): Payer: Self-pay

## 2016-08-13 ENCOUNTER — Ambulatory Visit: Payer: Self-pay | Admitting: Family Medicine

## 2016-08-13 DIAGNOSIS — Z0289 Encounter for other administrative examinations: Secondary | ICD-10-CM

## 2016-08-22 ENCOUNTER — Ambulatory Visit: Payer: Self-pay | Admitting: Family Medicine
